# Patient Record
Sex: Female | Born: 1990 | Race: White | Hispanic: No | Marital: Single | Smoking: Never smoker
Health system: Southern US, Community
[De-identification: ages and names within clinical notes are randomized; demographics above are authoritative.]

---

## 2010-12-06 ENCOUNTER — Ambulatory Visit: Payer: Self-pay | Admitting: Internal Medicine

## 2013-02-08 ENCOUNTER — Emergency Department: Payer: Self-pay | Admitting: Emergency Medicine

## 2017-02-11 ENCOUNTER — Emergency Department: Payer: Self-pay

## 2017-02-11 ENCOUNTER — Emergency Department
Admission: EM | Admit: 2017-02-11 | Discharge: 2017-02-11 | Disposition: A | Discharge status: Home / Self Care | Payer: Self-pay | Attending: Student in an Organized Health Care Education/Training Program | Admitting: Student in an Organized Health Care Education/Training Program

## 2017-02-11 ENCOUNTER — Encounter: Payer: Self-pay | Admitting: Emergency Medicine

## 2017-02-11 DIAGNOSIS — R55 Syncope and collapse: Secondary | ICD-10-CM | POA: Insufficient documentation

## 2017-02-11 LAB — BASIC METABOLIC PANEL
ANION GAP: 9 (ref 5–15)
BUN: 12 mg/dL (ref 6–20)
CALCIUM: 9.1 mg/dL (ref 8.9–10.3)
CO2: 25 mmol/L (ref 22–32)
Chloride: 105 mmol/L (ref 101–111)
Creatinine, Ser: 0.79 mg/dL (ref 0.44–1.00)
GFR calc Af Amer: >60 mL/min (ref >60)
GFR calc non Af Amer: >60 mL/min (ref >60)
GLUCOSE: 101 mg/dL — AB (ref 65–99)
POTASSIUM: 3.7 mmol/L (ref 3.5–5.1)
Sodium: 139 mmol/L (ref 135–145)

## 2017-02-11 LAB — CBC
HEMATOCRIT: 41.3 % (ref 35.0–47.0)
HEMOGLOBIN: 14.5 g/dL (ref 12.0–16.0)
MCH: 33.8 pg (ref 26.0–34.0)
MCHC: 35.1 g/dL (ref 32.0–36.0)
MCV: 96.2 fL (ref 80.0–100.0)
Platelets: 264 10*3/uL (ref 150–440)
RBC: 4.29 MIL/uL (ref 3.80–5.20)
RDW: 13 % (ref 11.5–14.5)
WBC: 9.6 10*3/uL (ref 3.6–11.0)

## 2017-02-11 LAB — URINALYSIS, COMPLETE (UACMP) WITH MICROSCOPIC
BACTERIA UA: NONE SEEN
Bilirubin Urine: NEGATIVE
Glucose, UA: NEGATIVE mg/dL
Hgb urine dipstick: NEGATIVE
Ketones, ur: NEGATIVE mg/dL
Leukocytes, UA: NEGATIVE
Nitrite: NEGATIVE
PROTEIN: NEGATIVE mg/dL
Specific Gravity, Urine: 1.008 (ref 1.005–1.030)
pH: 6 (ref 5.0–8.0)

## 2017-02-11 LAB — POCT PREGNANCY, URINE: PREG TEST UR: NEGATIVE

## 2017-02-11 LAB — GLUCOSE, CAPILLARY: Glucose-Capillary: 96 mg/dL (ref 65–99)

## 2017-02-11 NOTE — ED Provider Notes (Signed)
[facility] Emergency Department Provider Note    First MD Initiated Contact with Patient 02/11/17 1410     (approximate)  I have reviewed the triage vital signs and the nursing notes.   HISTORY  Chief Complaint Loss of Consciousness    HPI Alisha Ford is a 26 y.o. female facility complaint of syncopal episode that occurred this morning. Patient states that she was getting ready for work and started feeling lightheaded and easy. Then woke up with her boyfriend standing over her calling [employer]. She did feel sleepy. Denies any headache. No palpitations or chest pain. No history of syncope or seizures. No family history of sudden cardiac death. Otherwise feels fine. States that she has been having decreased sleep over the past several weeks. Denies any recent illnesses. No neck pain or numbness or tingling. No new medications.   History reviewed. No pertinent past medical history. FmHX: no seizure disorder or sudden cardiac death History reviewed. No pertinent surgical history. There are no active problems to display for this patient.     Prior to Admission medications   Not on File    Allergies Patient has no known allergies.    Social History Social History  Substance Use Topics  . Smoking status: Never Smoker  . Smokeless tobacco: Never Used  . Alcohol use Yes    Review of Systems Patient denies headaches, rhinorrhea, blurry vision, numbness, shortness of breath, chest pain, edema, cough, abdominal pain, nausea, vomiting, diarrhea, dysuria, fevers, rashes or hallucinations unless otherwise stated above in HPI. ____________________________________________   PHYSICAL EXAM:  VITAL SIGNS: Vitals:   02/11/17 1106 02/11/17 1402  BP: (!) 152/93 (!) 131/93  Pulse: (!) 57 65  Resp: 18 17  Temp: 98.4 F (36.9 C)   SpO2: 100% 100%    Constitutional: Alert and oriented. Well appearing and in no acute distress. Eyes: Conjunctivae are  normal.  Head: Atraumatic. Nose: No congestion/rhinnorhea. Mouth/Throat: Mucous membranes are moist.   Neck: No stridor. Painless ROM.  Cardiovascular: Normal rate, regular rhythm. Grossly normal heart sounds.  Good peripheral circulation. Respiratory: Normal respiratory effort.  No retractions. Lungs CTAB. Gastrointestinal: Soft and nontender. No distention. No abdominal bruits. No CVA tenderness. Genitourinary:  Musculoskeletal: No lower extremity tenderness nor edema.  No joint effusions. Neurologic:  CN- intact.  No facial droop, Normal FNF.  Normal heel to shin.  Sensation intact bilaterally. Normal speech and language. No gross focal neurologic deficits are appreciated. No gait instability. Skin:  Skin is warm, dry and intact. No rash noted. Psychiatric: Mood and affect are normal. Speech and behavior are normal.  ____________________________________________   LABS (all labs ordered are listed, but only abnormal results are displayed)  Results for orders placed or performed during the hospital encounter of 02/11/17 (from the past 24 hour(s))  Basic metabolic panel     Status: Abnormal   Collection Time: 02/11/17 11:10 AM  Result Value Ref Range   Sodium 139 135 - 145 mmol/L   Potassium 3.7 3.5 - 5.1 mmol/L   Chloride 105 101 - 111 mmol/L   CO2 25 22 - 32 mmol/L   Glucose, Bld 101 (H) 65 - 99 mg/dL   BUN 12 6 - 20 mg/dL   Creatinine, Ser 1.32 0.44 - 1.00 mg/dL   Calcium 9.1 8.9 - 44.0 mg/dL   GFR calc non Af Amer >60 >60 mL/min   GFR calc Af Amer >60 >60 mL/min   Anion gap 9 5 - 15  CBC  Status: None   Collection Time: 02/11/17 11:10 AM  Result Value Ref Range   WBC 9.6 3.6 - 11.0 K/uL   RBC 4.29 3.80 - 5.20 MIL/uL   Hemoglobin 14.5 12.0 - 16.0 g/dL   HCT 16.1 09.6 - 04.5 %   MCV 96.2 80.0 - 100.0 fL   MCH 33.8 26.0 - 34.0 pg   MCHC 35.1 32.0 - 36.0 g/dL   RDW 40.9 81.1 - 91.4 %   Platelets 264 150 - 440 K/uL  Urinalysis, Complete w Microscopic     Status:  Abnormal   Collection Time: 02/11/17 11:10 AM  Result Value Ref Range   Color, Urine YELLOW (A) YELLOW   APPearance CLEAR (A) CLEAR   Specific Gravity, Urine 1.008 1.005 - 1.030   pH 6.0 5.0 - 8.0   Glucose, UA NEGATIVE NEGATIVE mg/dL   Hgb urine dipstick NEGATIVE NEGATIVE   Bilirubin Urine NEGATIVE NEGATIVE   Ketones, ur NEGATIVE NEGATIVE mg/dL   Protein, ur NEGATIVE NEGATIVE mg/dL   Nitrite NEGATIVE NEGATIVE   Leukocytes, UA NEGATIVE NEGATIVE   RBC / HPF 0-5 0 - 5 RBC/hpf   WBC, UA 0-5 0 - 5 WBC/hpf   Bacteria, UA NONE SEEN NONE SEEN   Squamous Epithelial / LPF 0-5 (A) NONE SEEN   Mucus PRESENT   Glucose, capillary     Status: None   Collection Time: 02/11/17 11:13 AM  Result Value Ref Range   Glucose-Capillary 96 65 - 99 mg/dL  Pregnancy, urine POC     Status: None   Collection Time: 02/11/17 11:22 AM  Result Value Ref Range   Preg Test, Ur NEGATIVE NEGATIVE   ____________________________________________  EKG My review and personal interpretation at Time: 11:05   Indication: syncope  Rate: 65  Rhythm: sinus Axis: normal Other: normal intervals, no wpw, brugada or prolonged qt ____________________________________________  RADIOLOGY  I personally reviewed all radiographic images ordered to evaluate for the above acute complaints and reviewed radiology reports and findings.  These findings were personally discussed with the patient.  Please see medical record for radiology report.  ____________________________________________   PROCEDURES  Procedure(s) performed:  Procedures    Critical Care performed: no ____________________________________________   INITIAL IMPRESSION / ASSESSMENT AND PLAN / ED COURSE  Pertinent labs & imaging results that were available during my care of the patient were reviewed by me and considered in my medical decision making (see chart for details).  DDX: vasovagal, dehydration, dysrhythmia, seizure, mass  Alisha Ford is a 26  y.o. who presents to the ED with syncopal episode as described above. Patient currently well appearing and hemodynamically stable. Neuro exam is nonfocal. Patient is not pregnant and does not have any abdominal pain. No evidence of dehydration or metabolic acidosis. No recent infections or fevers. CT imaging ordered to evaluate for mass or abnormality given concern for first nontraumatic seizure. EKG shows no evidence of dysrhythmia or preexcitation syndrome.     ----------------------------------------- 3:24 PM on 02/11/2017 -----------------------------------------  CT head shows no acute intracranial abnormality. Patient remains asymptomatic and repeat exam is reassuring. Patient stable for follow-up as an outpatient. ____________________________________________   FINAL CLINICAL IMPRESSION(S) / ED DIAGNOSES  Final diagnoses:  Syncope and collapse      NEW MEDICATIONS STARTED DURING THIS VISIT:  New Prescriptions   No medications on file     Note:  This document was prepared using Dragon voice recognition software and may include unintentional dictation errors.    Willy Eddy, MD 02/11/17  1524  

## 2017-02-11 NOTE — ED Notes (Signed)
Pt discharged home after verbalizing understanding of discharge instructions; nad noted. 

## 2017-02-11 NOTE — ED Triage Notes (Signed)
Patient presents to ED via POV from home post syncopal episode. Patient states, "I was getting ready for work and I felt uneasy. Next thing I know I was on the floor". Event was witnessed by patients boyfriend. Patient denies hitting head. A&O x4 at this time. Bruising noted to right foot and right knee.

## 2018-04-16 IMAGING — CT CT HEAD W/O CM
3 series · 16 of 46 positions shown, 19 images · non-contrast
Comparison: None.

CLINICAL DATA: Syncope.

EXAM:
CT HEAD WITHOUT CONTRAST
TECHNIQUE: Contiguous axial images were obtained from the base of the skull
through the vertex without intravenous contrast.

[Series 2: head (person_name) (person_name) · axial · 0.41mm/px · z∈[-93,+27]mm · 10 of 29 slices shown, 13 images]
[im 3/29  brain]
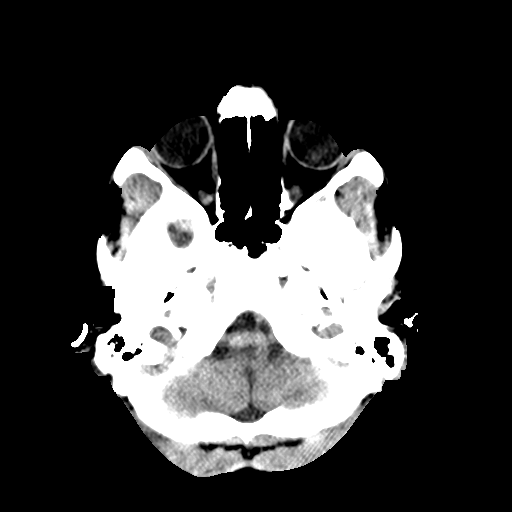
[im 3/29  bone]
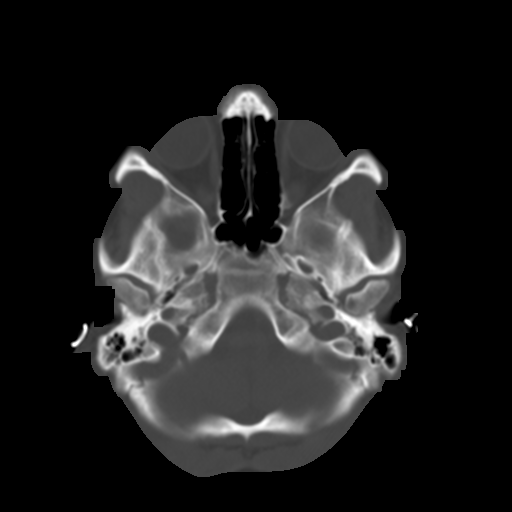
[im 6/29  brain]
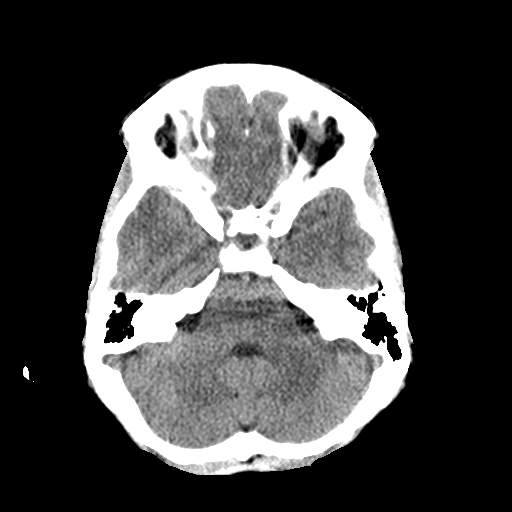
[im 8/29  brain]
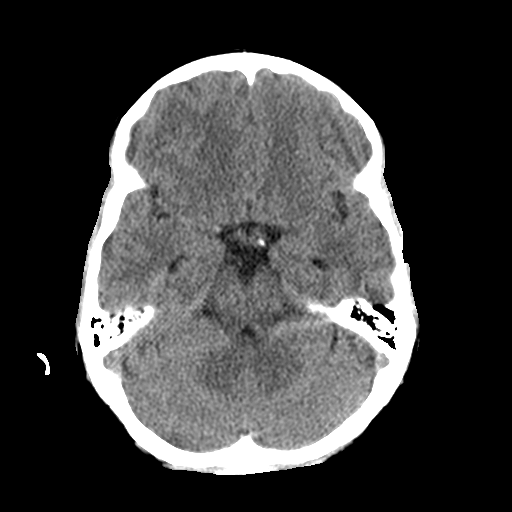
[im 11/29  brain]
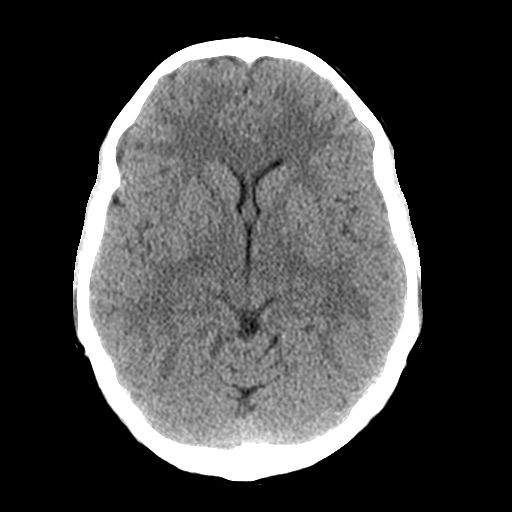
[im 14/29  brain]
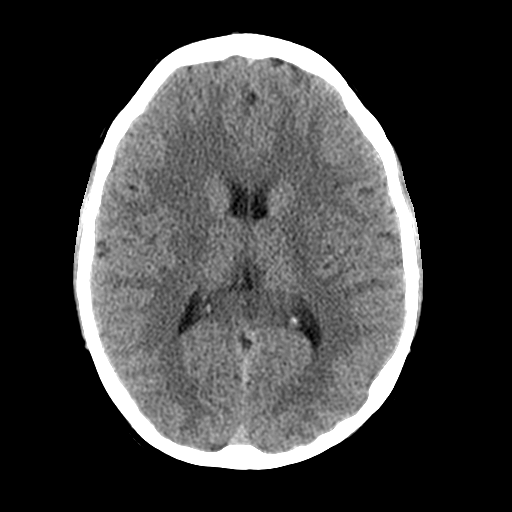
[im 14/29  bone]
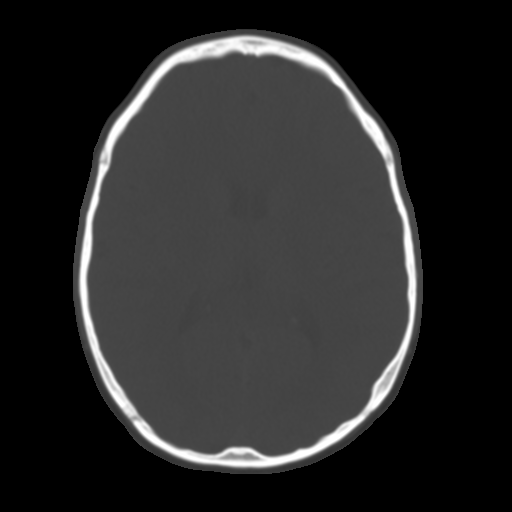
[im 16/29  brain]
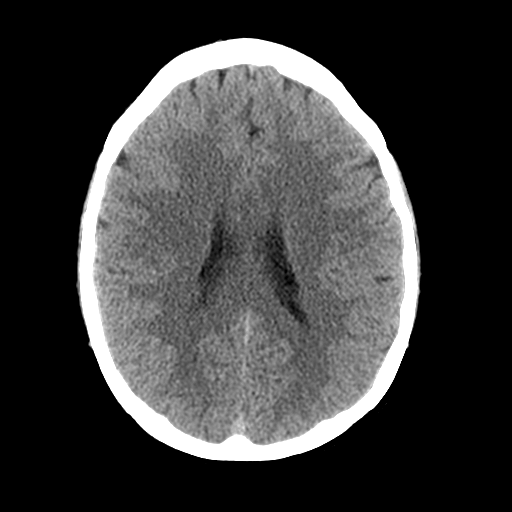
[im 19/29  brain]
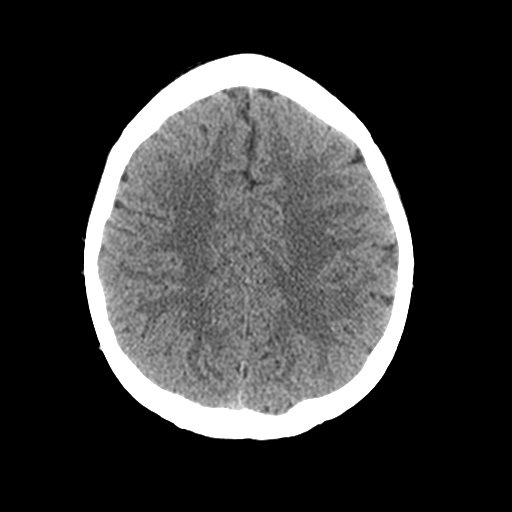
[im 22/29  brain]
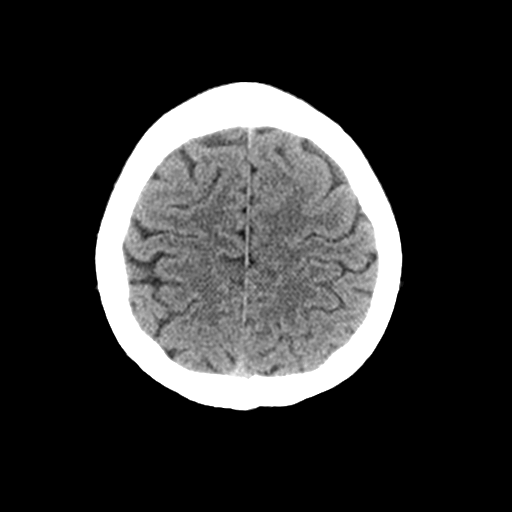
[im 24/29  brain]
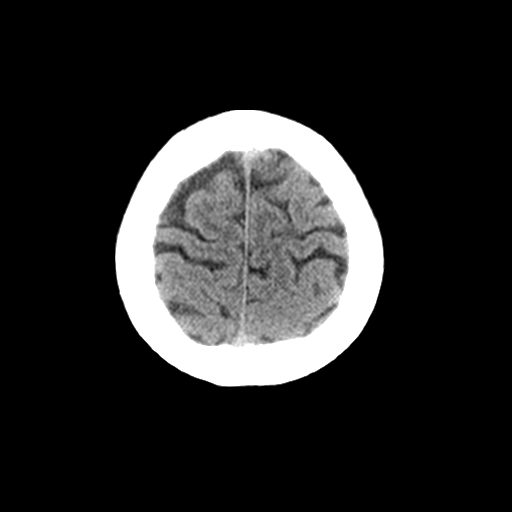
[im 24/29  bone]
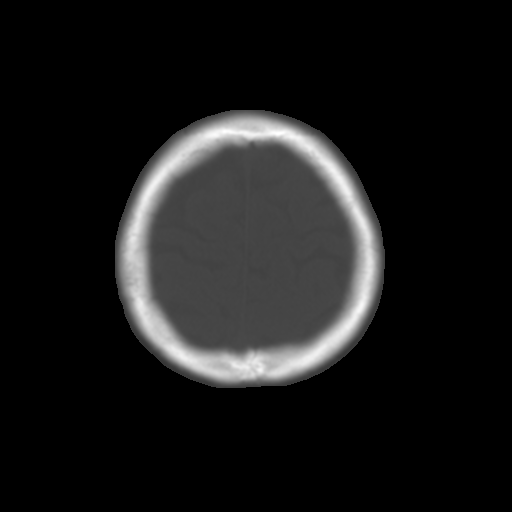
[im 27/29  brain]
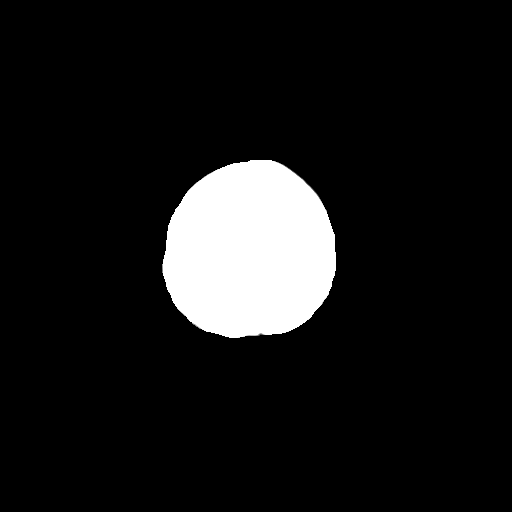

[Series 4: coronal soft tissue · coronal · 0.29mm/px · 3 of 58 slices shown]
[im 20/58  brain]
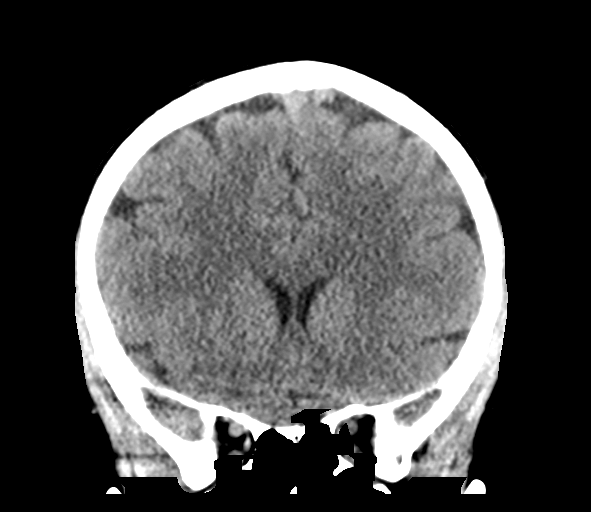
[im 26/58  brain]
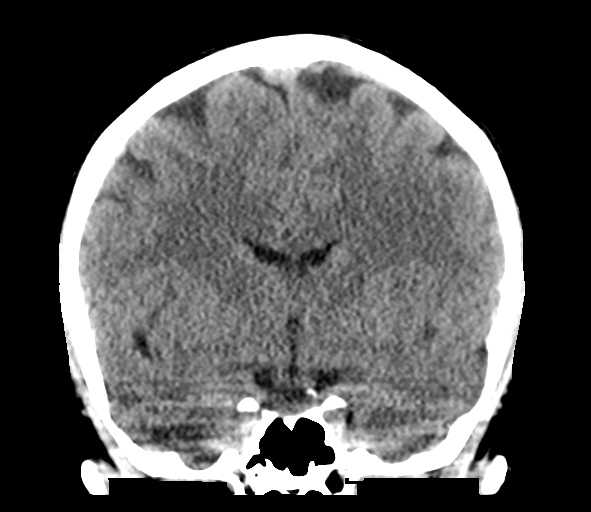
[im 32/58  brain]
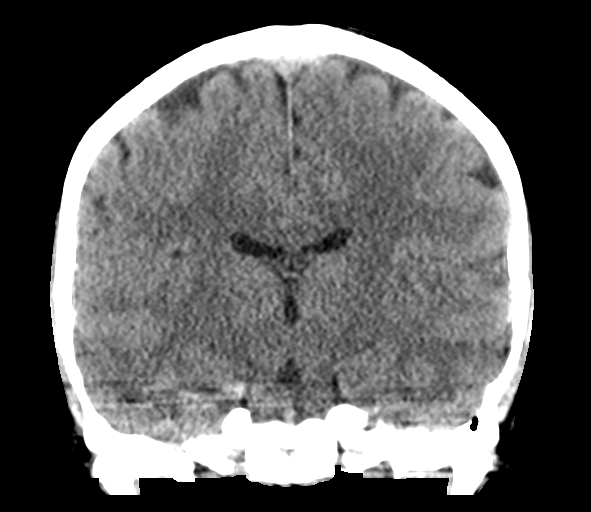

[Series 5: sagittal soft tissue · sagittal · 0.31mm/px · 3 of 49 slices shown]
[im 17/49  brain]
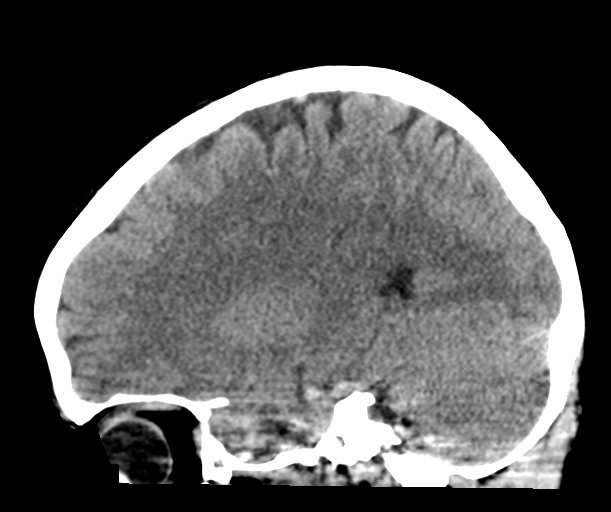
[im 25/49  brain]
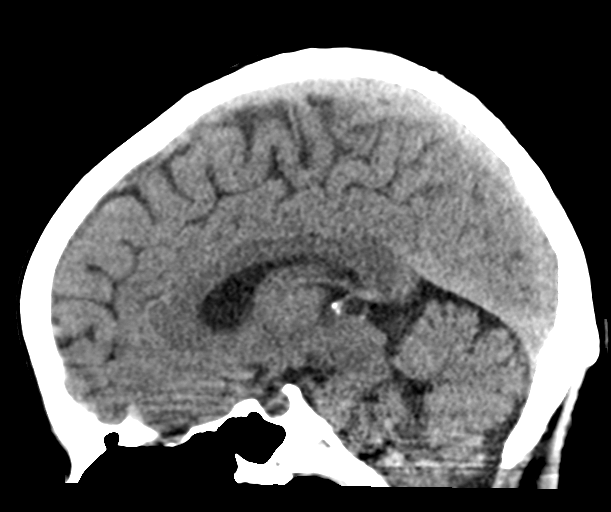
[im 33/49  brain]
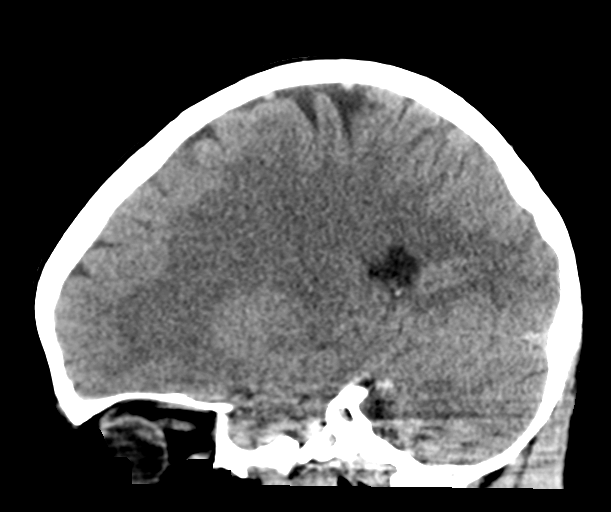

[16 of 46 positions shown; findings below may reference images not displayed]

FINDINGS: Brain: No evidence of acute infarction, hemorrhage, hydrocephalus,
extra-axial collection or mass lesion/mass effect.

Vascular: No hyperdense vessel or unexpected calcification.

Skull: Normal. Negative for fracture or focal lesion.

Sinuses/Orbits: No acute finding.

Other: None.
IMPRESSION: No acute abnormality.  No cause for syncope identified.

## 2019-04-09 ENCOUNTER — Other Ambulatory Visit: Payer: Self-pay

## 2019-04-09 DIAGNOSIS — Z20822 Contact with and (suspected) exposure to covid-19: Secondary | ICD-10-CM

## 2019-04-11 LAB — NOVEL CORONAVIRUS, NAA: SARS-CoV-2, NAA: NOT DETECTED
